# Patient Record
Sex: Female | Born: 1995 | Race: White | Hispanic: No | Marital: Single | Smoking: Never smoker
Health system: Southern US, Community
[De-identification: ages and names within clinical notes are randomized; demographics above are authoritative.]

## PROBLEM LIST (undated history)

## (undated) DIAGNOSIS — Z9189 Other specified personal risk factors, not elsewhere classified: Secondary | ICD-10-CM

## (undated) DIAGNOSIS — R51 Headache: Secondary | ICD-10-CM

## (undated) DIAGNOSIS — R519 Headache, unspecified: Secondary | ICD-10-CM

## (undated) DIAGNOSIS — T7422XA Child sexual abuse, confirmed, initial encounter: Secondary | ICD-10-CM

## (undated) HISTORY — DX: Other specified personal risk factors, not elsewhere classified: Z91.89

## (undated) HISTORY — DX: Headache, unspecified: R51.9

## (undated) HISTORY — DX: Child sexual abuse, confirmed, initial encounter: T74.22XA

## (undated) HISTORY — PX: NO PAST SURGERIES: SHX2092

## (undated) HISTORY — DX: Headache: R51

---
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
## 2017-04-06 ENCOUNTER — Ambulatory Visit (INDEPENDENT_AMBULATORY_CARE_PROVIDER_SITE_OTHER): Payer: BC Managed Care – PPO | Admitting: Family

## 2017-04-06 ENCOUNTER — Encounter: Payer: Self-pay | Admitting: Family

## 2017-04-06 VITALS — BP 130/66 | HR 100 | Temp 98.8°F | Ht 63.0 in | Wt 144.8 lb

## 2017-04-06 DIAGNOSIS — N94819 Vulvodynia, unspecified: Secondary | ICD-10-CM | POA: Diagnosis not present

## 2017-04-06 DIAGNOSIS — F4310 Post-traumatic stress disorder, unspecified: Secondary | ICD-10-CM

## 2017-04-06 MED ORDER — SERTRALINE HCL 50 MG PO TABS
50.0000 mg | ORAL_TABLET | Freq: Every day | ORAL | 0 refills | Status: DC
Start: 2017-04-06 — End: 2017-05-18

## 2017-04-06 NOTE — Progress Notes (Signed)
Pre visit review using our clinic review tool, if applicable. No additional management support is needed unless otherwise documented below in the visit note. 
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
## 2017-04-06 NOTE — Patient Instructions (Signed)
Trial of zoloft  Follow up 6 weeks  Let me know how you are doing

## 2017-04-07 ENCOUNTER — Encounter: Payer: Self-pay | Admitting: Family

## 2017-04-07 DIAGNOSIS — N94819 Vulvodynia, unspecified: Secondary | ICD-10-CM | POA: Insufficient documentation
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
## 2017-04-09 ENCOUNTER — Ambulatory Visit (INDEPENDENT_AMBULATORY_CARE_PROVIDER_SITE_OTHER): Payer: BC Managed Care – PPO | Admitting: Family

## 2017-04-09 ENCOUNTER — Encounter: Payer: Self-pay | Admitting: Family

## 2017-04-09 VITALS — BP 136/82 | HR 97 | Temp 98.6°F | Ht 63.0 in | Wt 140.4 lb

## 2017-04-09 DIAGNOSIS — R6889 Other general symptoms and signs: Secondary | ICD-10-CM | POA: Insufficient documentation

## 2017-04-09 LAB — POC INFLUENZA A&B (BINAX/QUICKVUE)
Influenza A, POC: NEGATIVE
Influenza B, POC: NEGATIVE

## 2017-04-09 LAB — POCT RAPID STREP A (OFFICE): RAPID STREP A SCREEN: NEGATIVE

## 2017-04-09 MED ORDER — BENZONATATE 100 MG PO CAPS: 100.0000 mg | ORAL_CAPSULE | Freq: Two times a day (BID) | ORAL | 0 refills | Status: DC | PRN

## 2017-04-09 NOTE — Patient Instructions (Signed)
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
## 2017-04-20 ENCOUNTER — Ambulatory Visit: Payer: Self-pay | Admitting: Family

## 2017-05-18 ENCOUNTER — Ambulatory Visit (INDEPENDENT_AMBULATORY_CARE_PROVIDER_SITE_OTHER): Payer: BC Managed Care – PPO | Admitting: Family

## 2017-05-18 ENCOUNTER — Encounter (INDEPENDENT_AMBULATORY_CARE_PROVIDER_SITE_OTHER): Payer: Self-pay

## 2017-05-18 ENCOUNTER — Encounter: Payer: Self-pay | Admitting: Family

## 2017-05-18 DIAGNOSIS — N94819 Vulvodynia, unspecified: Secondary | ICD-10-CM

## 2017-05-18 DIAGNOSIS — F4310 Post-traumatic stress disorder, unspecified: Secondary | ICD-10-CM

## 2017-05-18 MED ORDER — SERTRALINE HCL 100 MG PO TABS: 50.0000 mg | ORAL_TABLET | Freq: Every day | ORAL | 0 refills | Status: DC
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
## 2017-05-18 NOTE — Patient Instructions (Addendum)
Increase zoloft  Will make referral to counselor/specialist.   As discussed, information on vulvodynia.    Follow up in 8 weeks.
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
## 2017-07-13 ENCOUNTER — Ambulatory Visit (INDEPENDENT_AMBULATORY_CARE_PROVIDER_SITE_OTHER): Payer: BC Managed Care – PPO | Admitting: Family

## 2017-07-13 ENCOUNTER — Encounter: Payer: Self-pay | Admitting: Family

## 2017-07-13 VITALS — BP 126/86 | HR 96 | Temp 98.9°F | Ht 63.0 in | Wt 138.2 lb

## 2017-07-13 DIAGNOSIS — F4310 Post-traumatic stress disorder, unspecified: Secondary | ICD-10-CM | POA: Diagnosis not present

## 2017-07-13 DIAGNOSIS — Z30011 Encounter for initial prescription of contraceptive pills: Secondary | ICD-10-CM | POA: Diagnosis not present

## 2017-07-13 DIAGNOSIS — N94819 Vulvodynia, unspecified: Secondary | ICD-10-CM

## 2017-07-13 MED ORDER — LEVONORGESTREL-ETHINYL ESTRAD 0.1-20 MG-MCG PO TABS: 1.0000 | ORAL_TABLET | Freq: Every day | ORAL | 11 refills | Status: DC

## 2017-07-13 MED ORDER — SERTRALINE HCL 100 MG PO TABS: 100.0000 mg | ORAL_TABLET | Freq: Every day | ORAL | 0 refills | Status: DC

## 2017-07-13 NOTE — Assessment & Plan Note (Signed)
Pleased to see  improved. Patient declined referral to OB/GYN for further evaluation at this time.

## 2017-07-13 NOTE — Patient Instructions (Addendum)
As discussed, remember back up contraception with antiobiotics, missing pills.   One year supply birth control.   Increase zoloft to 100mg  at bedtime  Follow up 2-3 months.
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
## 2017-07-13 NOTE — Progress Notes (Signed)
Pre visit review using our clinic review tool, if applicable. No additional management support is needed unless otherwise documented below in the visit note. 

## 2017-07-14 NOTE — Assessment & Plan Note (Signed)
Pleased to see the patient is doing better on Zoloft. We agreed to increase 100 mg. Follow-up in 6-8 weeks

## 2017-07-14 NOTE — Assessment & Plan Note (Signed)
Patient evaluated and no contraindications to OCP at this time. Education provided on backup contraception. OCP started and one year supply given.

## 2017-10-05 ENCOUNTER — Encounter: Payer: Self-pay | Admitting: Family

## 2017-10-05 ENCOUNTER — Ambulatory Visit (INDEPENDENT_AMBULATORY_CARE_PROVIDER_SITE_OTHER): Payer: BC Managed Care – PPO | Admitting: Family

## 2017-10-05 VITALS — BP 106/68 | HR 80 | Temp 98.5°F | Ht 63.01 in | Wt 137.6 lb

## 2017-10-05 DIAGNOSIS — F4310 Post-traumatic stress disorder, unspecified: Secondary | ICD-10-CM

## 2017-10-05 DIAGNOSIS — G479 Sleep disorder, unspecified: Secondary | ICD-10-CM

## 2017-10-05 MED ORDER — ESCITALOPRAM OXALATE 10 MG PO TABS: 10.0000 mg | ORAL_TABLET | Freq: Every day | ORAL | 1 refills | Status: DC
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
## 2017-10-05 NOTE — Assessment & Plan Note (Signed)
Unchanged. Will trial another SSRI. Titrate down zoloft and then start lexapro. Sleeping problems appear more related to lifestyle factors than anxiety. Declines counseling. Sleep hygiene discussed. Fu 6-8 weeks.

## 2017-10-05 NOTE — Progress Notes (Signed)
Pre visit review using our clinic review tool, if applicable. No additional management support is needed unless otherwise documented below in the visit note. 

## 2017-10-05 NOTE — Patient Instructions (Signed)
Start taking  zoloft at bedtime for next 3-5 days, then STOP. ON the following day, start lexapro ( new medication).   Follow up 6-8 weeks   Essentials for good sleep:   #1 Exercise #2 Limit Caffeine ( no caffeine after lunch) #3 No smart phones, TV prior to bed -- BLUE light is VERY activating and send the brain an 'awake message.'  #4 Go to bed at same time of night each night and get up at same time of day.  #5 Take 0.5 to  melatonin at 7pm with dinner -this is when natural melatonin will start to increase #6 May try over the counter Unisom for sleep aid

## 2017-10-08 ENCOUNTER — Other Ambulatory Visit: Payer: Self-pay | Admitting: Family

## 2017-10-08 DIAGNOSIS — F4310 Post-traumatic stress disorder, unspecified: Secondary | ICD-10-CM

## 2017-12-07 ENCOUNTER — Other Ambulatory Visit: Payer: Self-pay | Admitting: Family

## 2017-12-07 DIAGNOSIS — F4310 Post-traumatic stress disorder, unspecified: Secondary | ICD-10-CM

## 2017-12-24 ENCOUNTER — Ambulatory Visit (INDEPENDENT_AMBULATORY_CARE_PROVIDER_SITE_OTHER): Payer: BC Managed Care – PPO | Admitting: Obstetrics and Gynecology

## 2017-12-24 ENCOUNTER — Encounter: Payer: Self-pay | Admitting: Obstetrics and Gynecology

## 2017-12-24 VITALS — BP 114/70 | Ht 63.0 in | Wt 144.0 lb

## 2017-12-24 DIAGNOSIS — R102 Pelvic and perineal pain: Secondary | ICD-10-CM

## 2017-12-24 DIAGNOSIS — Z62810 Personal history of physical and sexual abuse in childhood: Secondary | ICD-10-CM | POA: Diagnosis not present
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
## 2017-12-25 ENCOUNTER — Other Ambulatory Visit: Payer: Self-pay

## 2017-12-25 DIAGNOSIS — F4310 Post-traumatic stress disorder, unspecified: Secondary | ICD-10-CM

## 2017-12-25 MED ORDER — ESCITALOPRAM OXALATE 10 MG PO TABS: 10.0000 mg | ORAL_TABLET | Freq: Every day | ORAL | 1 refills | Status: AC

## 2018-01-06 ENCOUNTER — Ambulatory Visit (INDEPENDENT_AMBULATORY_CARE_PROVIDER_SITE_OTHER): Payer: BC Managed Care – PPO | Admitting: Obstetrics and Gynecology

## 2018-01-06 ENCOUNTER — Ambulatory Visit (INDEPENDENT_AMBULATORY_CARE_PROVIDER_SITE_OTHER): Payer: BC Managed Care – PPO

## 2018-01-06 ENCOUNTER — Encounter: Payer: Self-pay | Admitting: Obstetrics and Gynecology

## 2018-01-06 VITALS — BP 110/66 | HR 73 | Ht 63.0 in | Wt 140.0 lb

## 2018-01-06 DIAGNOSIS — F4310 Post-traumatic stress disorder, unspecified: Secondary | ICD-10-CM | POA: Diagnosis not present

## 2018-01-06 DIAGNOSIS — N94819 Vulvodynia, unspecified: Secondary | ICD-10-CM

## 2018-01-06 DIAGNOSIS — R102 Pelvic and perineal pain: Secondary | ICD-10-CM | POA: Diagnosis not present

## 2018-01-06 MED ORDER — LIDOCAINE 5 % EX OINT: 1.0000 "application " | TOPICAL_OINTMENT | CUTANEOUS | 3 refills | Status: AC | PRN
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
## 2018-01-12 LAB — NUSWAB VAGINITIS PLUS (VG+)
CANDIDA ALBICANS, NAA: NEGATIVE
Candida glabrata, NAA: NEGATIVE
Chlamydia trachomatis, NAA: NEGATIVE
Neisseria gonorrhoeae, NAA: NEGATIVE
Trich vag by NAA: NEGATIVE

## 2018-01-12 NOTE — Progress Notes (Signed)
Negative, [facility]

## 2018-03-29 ENCOUNTER — Other Ambulatory Visit: Payer: Self-pay | Admitting: Family

## 2018-03-29 DIAGNOSIS — Z30011 Encounter for initial prescription of contraceptive pills: Secondary | ICD-10-CM

## 2018-09-14 ENCOUNTER — Other Ambulatory Visit: Payer: Self-pay | Admitting: Family

## 2018-09-14 DIAGNOSIS — Z30011 Encounter for initial prescription of contraceptive pills: Secondary | ICD-10-CM

## 2019-02-27 ENCOUNTER — Other Ambulatory Visit: Payer: Self-pay | Admitting: Family

## 2019-02-27 DIAGNOSIS — Z30011 Encounter for initial prescription of contraceptive pills: Secondary | ICD-10-CM
